# Patient Record
Sex: Female | Born: 1985 | Race: Black or African American | Hispanic: No | Marital: Single | Smoking: Current every day smoker
Health system: Southern US, Community
[De-identification: ages and names within clinical notes are randomized; demographics above are authoritative.]

---

## 2016-09-02 ENCOUNTER — Encounter (HOSPITAL_COMMUNITY): Payer: Self-pay | Admitting: Emergency Medicine

## 2016-09-02 ENCOUNTER — Ambulatory Visit (INDEPENDENT_AMBULATORY_CARE_PROVIDER_SITE_OTHER): Payer: Self-pay

## 2016-09-02 ENCOUNTER — Ambulatory Visit (HOSPITAL_COMMUNITY)
Admission: EM | Admit: 2016-09-02 | Discharge: 2016-09-02 | Disposition: A | Discharge status: Home / Self Care | Payer: Self-pay | Attending: Internal Medicine | Admitting: Internal Medicine

## 2016-09-02 DIAGNOSIS — M25512 Pain in left shoulder: Secondary | ICD-10-CM

## 2016-09-02 MED ORDER — DICLOFENAC SODIUM 75 MG PO TBEC: 75.0000 mg | DELAYED_RELEASE_TABLET | Freq: Two times a day (BID) | ORAL | 0 refills | Status: AC

## 2016-09-02 MED ORDER — PREDNISONE 10 MG (21) PO TBPK: ORAL_TABLET | ORAL | 0 refills | Status: AC
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
## 2016-09-02 NOTE — Discharge Instructions (Signed)
Your xrays are negative for any acute findings such as fracture or dislocation. You most likely have a soft tissue injury. I have prescribed a prednisone dose pack for inflammation and I have prescribed diclofenac for pain, take 1 tablet twice a day. I recommend you rest the arm, keep it elevated, apply ice for 15 minutes at a time at least 4 times a day. Should you not receive any relief from these treatments, I recommend you follow up with your orthopedic physicians.
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
## 2016-09-22 ENCOUNTER — Ambulatory Visit (HOSPITAL_COMMUNITY)
Admission: EM | Admit: 2016-09-22 | Discharge: 2016-09-22 | Disposition: A | Discharge status: Home / Self Care | Payer: Self-pay | Attending: Family Medicine | Admitting: Family Medicine

## 2016-09-22 ENCOUNTER — Encounter (HOSPITAL_COMMUNITY): Payer: Self-pay

## 2016-09-22 DIAGNOSIS — H00011 Hordeolum externum right upper eyelid: Secondary | ICD-10-CM

## 2016-09-22 DIAGNOSIS — L03213 Periorbital cellulitis: Secondary | ICD-10-CM

## 2016-09-22 MED ORDER — ERYTHROMYCIN 5 MG/GM OP OINT: TOPICAL_OINTMENT | OPHTHALMIC | 0 refills | Status: AC

## 2016-09-22 MED ORDER — DOXYCYCLINE HYCLATE 100 MG PO CAPS: 100.0000 mg | ORAL_CAPSULE | Freq: Two times a day (BID) | ORAL | 0 refills | Status: AC

## 2016-09-22 NOTE — ED Triage Notes (Signed)
Pt has a stye on her right eye for longer than a month, has burst and still present. Said she has been doing the warm compress on it. Hard to drive at night with it present.

## 2016-09-22 NOTE — ED Provider Notes (Signed)
CSN: [id number]     Arrival date & time 09/22/16  1601 History   First MD Initiated Contact with Patient 09/22/16 1625     Chief Complaint  Patient presents with  . Stye   (Consider location/radiation/quality/duration/timing/severity/associated sxs/prior Treatment) Patient c/o right upper eye lid stye and right periorbital eye swelling.   The history is provided by the patient.  Eye Problem  Location:  Right eye Quality:  Unable to specify Severity:  Moderate Onset quality:  Sudden Duration:  1 month Timing:  Constant Progression:  Worsening Chronicity:  New Relieved by:  Nothing Worsened by:  Nothing Ineffective treatments:  None tried Associated symptoms: redness     History reviewed. No pertinent past medical history. History reviewed. No pertinent surgical history. No family history on file. Social History  Substance Use Topics  . Smoking status: Current Every Day Smoker    Packs/day: 1.00    Years: 10.00    Types: Cigarettes  . Smokeless tobacco: Never Used  . Alcohol use No   OB History    No data available     Review of Systems  Constitutional: Negative.   HENT: Negative.   Eyes: Positive for pain and redness.  Respiratory: Negative.   Cardiovascular: Negative.   Gastrointestinal: Negative.   Endocrine: Negative.   Genitourinary: Negative.   Musculoskeletal: Negative.   Allergic/Immunologic: Negative.   Neurological: Negative.   Hematological: Negative.   Psychiatric/Behavioral: Negative.     Allergies  Patient has no known allergies.  Home Medications   Prior to Admission medications   Medication Sig Start Date End Date Taking? Authorizing Provider  diclofenac (VOLTAREN) 75 MG EC tablet Take 1 tablet (75 mg total) by mouth 2 (two) times daily. 09/02/16   Dorena Bodo, NP  doxycycline (VIBRAMYCIN) 100 MG capsule Take 1 capsule (100 mg total) by mouth 2 (two) times daily. 09/22/16   Deatra Canter, FNP  erythromycin ophthalmic ointment Place  a 1/2 inch ribbon of ointment into the right upper eyelid. 09/22/16   Deatra Canter, FNP  predniSONE (STERAPRED UNI-PAK 21 TAB) 10 MG (21) TBPK tablet Take 4 tablets for 3 days, then 3 tablets for 3 days, then 2 tablets for 2 days, then 1 tablet 09/02/16   Dorena Bodo, NP   Meds Ordered and Administered this Visit  Medications - No data to display  BP 114/70 (BP Location: Left Arm)   Pulse 81   Temp 98 F (36.7 C) (Oral)   Resp 20   LMP 09/22/2016 (Exact Date)   SpO2 100%  No data found.   Physical Exam  Constitutional: She appears well-developed and well-nourished.  HENT:  Head: Normocephalic and atraumatic.  Right Ear: External ear normal.  Left Ear: External ear normal.  Mouth/Throat: Oropharynx is clear and moist.  Eyes: Conjunctivae and EOM are normal. Pupils are equal, round, and reactive to light.  Right upper lateral eyelid with stye and erythema of eyelid.  Neck: Normal range of motion. Neck supple.  Cardiovascular: Normal rate, regular rhythm and normal heart sounds.   Pulmonary/Chest: Effort normal and breath sounds normal.  Nursing note and vitals reviewed.   Urgent Care Course     Procedures (including critical care time)  Labs Review Labs Reviewed - No data to display  Imaging Review No results found.   Visual Acuity Review  Right Eye Distance:   Left Eye Distance:   Bilateral Distance:    Right Eye Near:   Left Eye Near:    Bilateral  Near:         MDM   1. Hordeolum externum of right upper eyelid   2. Periorbital cellulitis of right eye    Doxycycline 100mg  one po bid x 10 days #20 Erythromycin opthalmic ointment apply 1/2 inch to right upper eye lid qid      Deatra CanterWilliam J Demmi Sindt, FNP 09/22/16 1709
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
